# Patient Record
Sex: Male | Born: 2000 | Race: White | Hispanic: No | Marital: Single | Smoking: Never smoker
Health system: Southern US, Community
[De-identification: ages and names within clinical notes are randomized; demographics above are authoritative.]

---
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
## 2022-02-12 ENCOUNTER — Ambulatory Visit
Admission: EM | Admit: 2022-02-12 | Discharge: 2022-02-12 | Disposition: A | Discharge status: Home / Self Care | Payer: Managed Care, Other (non HMO)

## 2022-02-12 ENCOUNTER — Encounter: Payer: Self-pay | Admitting: Emergency Medicine

## 2022-02-12 DIAGNOSIS — J069 Acute upper respiratory infection, unspecified: Secondary | ICD-10-CM | POA: Diagnosis not present

## 2022-02-12 DIAGNOSIS — H9202 Otalgia, left ear: Secondary | ICD-10-CM

## 2022-02-12 DIAGNOSIS — J029 Acute pharyngitis, unspecified: Secondary | ICD-10-CM | POA: Diagnosis not present

## 2022-02-12 DIAGNOSIS — R0981 Nasal congestion: Secondary | ICD-10-CM

## 2022-02-12 NOTE — ED Provider Notes (Signed)
[facility]    CSN: [id number] Arrival date & time: 02/12/22  1155      History   Chief Complaint Chief Complaint  Patient presents with   Sore Throat   Otalgia   Nasal Congestion    HPI Willie Sweeney is a 22 y.o. male presenting with his mother for 3 to 4-day history of cough, congestion, left-sided ear pain/pressure, sore throat and headaches.  Denies fever, fatigue, breathing difficulty, vomiting or diarrhea.  Has been around 4-5 sick family members.  No one has been evaluated or tested for COVID or flu.  He says that he has had a negative COVID test in the past week.  Has been taking Sudafed over-the-counter for symptoms.  Is unsure if he needs antibiotic at this point or not.  No other complaints.  HPI  History reviewed. No pertinent past medical history.  There are no problems to display for this patient.   Past Surgical History:  Procedure Laterality Date   TONSILLECTOMY  06/2006       Home Medications    Prior to Admission medications   Not on File    Family History Family History  Problem Relation Age of Onset   Healthy Mother    Healthy Father     Social History Social History   Tobacco Use   Smoking status: Never   Smokeless tobacco: Never  Vaping Use   Vaping Use: Never used  Substance Use Topics   Alcohol use: Yes    Comment: social   Drug use: No     Allergies   Patient has no known allergies.   Review of Systems Review of Systems  Constitutional:  Positive for fatigue. Negative for fever.  HENT:  Positive for congestion, ear pain, rhinorrhea and sore throat. Negative for sinus pressure and sinus pain.   Respiratory:  Positive for cough. Negative for shortness of breath.   Gastrointestinal:  Negative for abdominal pain, diarrhea, nausea and vomiting.  Musculoskeletal:  Negative for myalgias.  Neurological:  Positive for headaches. Negative for weakness and light-headedness.  Hematological:  Negative for adenopathy.      Physical Exam Triage Vital Signs ED Triage Vitals  Enc Vitals Group     BP 02/12/22 1340 133/72     Pulse Rate 02/12/22 1340 72     Resp 02/12/22 1340 18     Temp 02/12/22 1340 97.8 F (36.6 C)     Temp Source 02/12/22 1340 Oral     SpO2 02/12/22 1340 97 %     Weight --      Height --      Head Circumference --      Peak Flow --      Pain Score 02/12/22 1339 4     Pain Loc --      Pain Edu? --      Excl. in [place]? --    No data found.  Updated Vital Signs BP 133/72 (BP Location: Right Arm)   Pulse 72   Temp 97.8 F (36.6 C) (Oral)   Resp 18   SpO2 97%    Physical Exam Vitals and nursing note reviewed.  Constitutional:      General: He is not in acute distress.    Appearance: Normal appearance. He is well-developed. He is not ill-appearing.  HENT:     Head: Normocephalic and atraumatic.     Right Ear: Tympanic membrane, ear canal and external ear normal.     Left Ear: Tympanic membrane,  ear canal and external ear normal.     Nose: Congestion present.     Mouth/Throat:     Mouth: Mucous membranes are moist.     Pharynx: Oropharynx is clear. Posterior oropharyngeal erythema present.  Eyes:     General: No scleral icterus.    Conjunctiva/sclera: Conjunctivae normal.  Cardiovascular:     Rate and Rhythm: Normal rate and regular rhythm.     Heart sounds: Normal heart sounds.  Pulmonary:     Effort: Pulmonary effort is normal. No respiratory distress.     Breath sounds: Normal breath sounds.  Musculoskeletal:     Cervical back: Neck supple.  Skin:    General: Skin is warm and dry.     Capillary Refill: Capillary refill takes less than 2 seconds.  Neurological:     General: No focal deficit present.     Mental Status: He is alert. Mental status is at baseline.     Motor: No weakness.     Gait: Gait normal.  Psychiatric:        Mood and Affect: Mood normal.      UC Treatments / Results  Labs (all labs ordered are listed, but only abnormal results are  displayed) Labs Reviewed - No data to display  EKG   Radiology No results found.  Procedures Procedures (including critical care time)  Medications Ordered in UC Medications - No data to display  Initial Impression / Assessment and Plan / UC Course  I have reviewed the triage vital signs and the nursing notes.  Pertinent labs & imaging results that were available during my care of the patient were reviewed by me and considered in my medical decision making (see chart for details).   22 year old male presents for 3 to 4-day history of cough, congestion, sore throat, postnasal drainage, left-sided ear pain/pressure.  Has been around multiple sick family members.  Vitals normal and stable and he is overall well-appearing.  No evidence of ear infection on exam.  He does have nasal congestion and erythema posterior pharynx with a large amount of clear postnasal drainage.  Chest clear auscultation.  Viral URI.  We discussed COVID testing and strep testing but he declines.  Advised of supportive care with continued Mucinex and starting nasal spray such as Flonase.  Advised that he should feel better within a week or so.  Reviewed return precautions.   Final Clinical Impressions(s) / UC Diagnoses   Final diagnoses:  Viral upper respiratory tract infection  Nasal congestion  Sore throat  Left ear pain     Discharge Instructions      URI/COLD SYMPTOMS: Your exam today is consistent with a viral illness. Antibiotics are not indicated at this time. Use medications as directed, including cough syrup, nasal saline, and decongestants. Your symptoms should improve over the next few days and resolve within 7-10 days. Increase rest and fluids. F/u if symptoms worsen or predominate such as sore throat, ear pain, productive cough, shortness of breath, or if you develop high fevers or worsening fatigue over the next several days.       ED Prescriptions   None    PDMP not reviewed this  encounter.   Danton Clap, PA-C 02/12/22 [phone]

## 2022-02-12 NOTE — Discharge Instructions (Signed)
URI/COLD SYMPTOMS: Your exam today is consistent with a viral illness. Antibiotics are not indicated at this time. Use medications as directed, including cough syrup, nasal saline, and decongestants. Your symptoms should improve over the next few days and resolve within 7-10 days. Increase rest and fluids. F/u if symptoms worsen or predominate such as sore throat, ear pain, productive cough, shortness of breath, or if you develop high fevers or worsening fatigue over the next several days.    

## 2022-02-12 NOTE — ED Triage Notes (Signed)
Pt presents with left ear pain, nasal congestion, headache x3-4 days.
